# Patient Record
Sex: Female | Born: 1950 | Race: White | Hispanic: No | Marital: Married | Smoking: Current every day smoker
Health system: Southern US, Community
[De-identification: ages and names within clinical notes are randomized; demographics above are authoritative.]

## PROBLEM LIST (undated history)

## (undated) DIAGNOSIS — G629 Polyneuropathy, unspecified: Secondary | ICD-10-CM

## (undated) DIAGNOSIS — K219 Gastro-esophageal reflux disease without esophagitis: Secondary | ICD-10-CM

## (undated) DIAGNOSIS — G709 Myoneural disorder, unspecified: Secondary | ICD-10-CM

## (undated) DIAGNOSIS — G40909 Epilepsy, unspecified, not intractable, without status epilepticus: Secondary | ICD-10-CM

## (undated) DIAGNOSIS — G40A09 Absence epileptic syndrome, not intractable, without status epilepticus: Secondary | ICD-10-CM

## (undated) DIAGNOSIS — R062 Wheezing: Secondary | ICD-10-CM

## (undated) DIAGNOSIS — E119 Type 2 diabetes mellitus without complications: Secondary | ICD-10-CM

## (undated) DIAGNOSIS — F17200 Nicotine dependence, unspecified, uncomplicated: Secondary | ICD-10-CM

## (undated) DIAGNOSIS — G4700 Insomnia, unspecified: Secondary | ICD-10-CM

## (undated) DIAGNOSIS — J40 Bronchitis, not specified as acute or chronic: Secondary | ICD-10-CM

## (undated) DIAGNOSIS — E785 Hyperlipidemia, unspecified: Secondary | ICD-10-CM

## (undated) HISTORY — DX: Myoneural disorder, unspecified: G70.9

## (undated) HISTORY — DX: Epilepsy, unspecified, not intractable, without status epilepticus: G40.909

## (undated) HISTORY — DX: Gastro-esophageal reflux disease without esophagitis: K21.9

## (undated) HISTORY — PX: OVARIAN CYST REMOVAL: SHX89

## (undated) HISTORY — DX: Nicotine dependence, unspecified, uncomplicated: F17.200

## (undated) HISTORY — DX: Absence epileptic syndrome, not intractable, without status epilepticus: G40.A09

## (undated) HISTORY — DX: Polyneuropathy, unspecified: G62.9

## (undated) HISTORY — DX: Insomnia, unspecified: G47.00

## (undated) HISTORY — PX: BIOPSY THYROID: PRO38

## (undated) HISTORY — DX: Bronchitis, not specified as acute or chronic: J40

## (undated) HISTORY — DX: Type 2 diabetes mellitus without complications: E11.9

## (undated) HISTORY — DX: Hyperlipidemia, unspecified: E78.5

## (undated) HISTORY — DX: Wheezing: R06.2

---

## 2014-05-08 DIAGNOSIS — E119 Type 2 diabetes mellitus without complications: Secondary | ICD-10-CM | POA: Diagnosis not present

## 2014-05-08 DIAGNOSIS — R1013 Epigastric pain: Secondary | ICD-10-CM | POA: Diagnosis not present

## 2014-05-08 DIAGNOSIS — G629 Polyneuropathy, unspecified: Secondary | ICD-10-CM | POA: Diagnosis not present

## 2014-05-08 DIAGNOSIS — G4700 Insomnia, unspecified: Secondary | ICD-10-CM | POA: Diagnosis not present

## 2014-06-12 DIAGNOSIS — R112 Nausea with vomiting, unspecified: Secondary | ICD-10-CM | POA: Diagnosis not present

## 2014-06-12 DIAGNOSIS — F17200 Nicotine dependence, unspecified, uncomplicated: Secondary | ICD-10-CM | POA: Diagnosis not present

## 2014-06-12 DIAGNOSIS — R1013 Epigastric pain: Secondary | ICD-10-CM | POA: Diagnosis not present

## 2014-06-16 DIAGNOSIS — G629 Polyneuropathy, unspecified: Secondary | ICD-10-CM | POA: Diagnosis not present

## 2014-06-16 DIAGNOSIS — K2900 Acute gastritis without bleeding: Secondary | ICD-10-CM | POA: Diagnosis not present

## 2014-06-16 DIAGNOSIS — F17200 Nicotine dependence, unspecified, uncomplicated: Secondary | ICD-10-CM | POA: Diagnosis not present

## 2014-06-16 DIAGNOSIS — R112 Nausea with vomiting, unspecified: Secondary | ICD-10-CM | POA: Diagnosis not present

## 2014-06-16 DIAGNOSIS — K319 Disease of stomach and duodenum, unspecified: Secondary | ICD-10-CM | POA: Diagnosis not present

## 2014-06-16 DIAGNOSIS — E119 Type 2 diabetes mellitus without complications: Secondary | ICD-10-CM | POA: Diagnosis not present

## 2014-06-16 DIAGNOSIS — K449 Diaphragmatic hernia without obstruction or gangrene: Secondary | ICD-10-CM | POA: Diagnosis not present

## 2014-06-16 DIAGNOSIS — E780 Pure hypercholesterolemia: Secondary | ICD-10-CM | POA: Diagnosis not present

## 2014-06-16 DIAGNOSIS — K2970 Gastritis, unspecified, without bleeding: Secondary | ICD-10-CM | POA: Diagnosis not present

## 2014-06-16 DIAGNOSIS — R1013 Epigastric pain: Secondary | ICD-10-CM | POA: Diagnosis not present

## 2014-06-16 DIAGNOSIS — J40 Bronchitis, not specified as acute or chronic: Secondary | ICD-10-CM | POA: Diagnosis not present

## 2014-06-16 DIAGNOSIS — G4709 Other insomnia: Secondary | ICD-10-CM | POA: Diagnosis not present

## 2014-06-16 HISTORY — PX: ESOPHAGOGASTRODUODENOSCOPY: SHX1529

## 2014-07-02 DIAGNOSIS — G40A09 Absence epileptic syndrome, not intractable, without status epilepticus: Secondary | ICD-10-CM | POA: Diagnosis not present

## 2014-07-02 DIAGNOSIS — G4700 Insomnia, unspecified: Secondary | ICD-10-CM | POA: Diagnosis not present

## 2014-07-02 DIAGNOSIS — E119 Type 2 diabetes mellitus without complications: Secondary | ICD-10-CM | POA: Diagnosis not present

## 2014-07-02 DIAGNOSIS — E785 Hyperlipidemia, unspecified: Secondary | ICD-10-CM | POA: Diagnosis not present

## 2014-07-31 DIAGNOSIS — Z1231 Encounter for screening mammogram for malignant neoplasm of breast: Secondary | ICD-10-CM | POA: Diagnosis not present

## 2014-12-03 DIAGNOSIS — Z720 Tobacco use: Secondary | ICD-10-CM | POA: Diagnosis not present

## 2014-12-03 DIAGNOSIS — G2581 Restless legs syndrome: Secondary | ICD-10-CM | POA: Diagnosis not present

## 2014-12-03 DIAGNOSIS — E119 Type 2 diabetes mellitus without complications: Secondary | ICD-10-CM | POA: Diagnosis not present

## 2014-12-03 DIAGNOSIS — E785 Hyperlipidemia, unspecified: Secondary | ICD-10-CM | POA: Diagnosis not present

## 2014-12-23 DIAGNOSIS — F17200 Nicotine dependence, unspecified, uncomplicated: Secondary | ICD-10-CM | POA: Diagnosis not present

## 2014-12-23 DIAGNOSIS — G629 Polyneuropathy, unspecified: Secondary | ICD-10-CM | POA: Diagnosis not present

## 2014-12-23 DIAGNOSIS — E789 Disorder of lipoprotein metabolism, unspecified: Secondary | ICD-10-CM | POA: Diagnosis not present

## 2014-12-23 DIAGNOSIS — E118 Type 2 diabetes mellitus with unspecified complications: Secondary | ICD-10-CM | POA: Diagnosis not present

## 2015-02-03 DIAGNOSIS — E118 Type 2 diabetes mellitus with unspecified complications: Secondary | ICD-10-CM | POA: Diagnosis not present

## 2015-02-03 DIAGNOSIS — E789 Disorder of lipoprotein metabolism, unspecified: Secondary | ICD-10-CM | POA: Diagnosis not present

## 2015-02-03 DIAGNOSIS — G629 Polyneuropathy, unspecified: Secondary | ICD-10-CM | POA: Diagnosis not present

## 2015-05-26 DIAGNOSIS — E785 Hyperlipidemia, unspecified: Secondary | ICD-10-CM | POA: Diagnosis not present

## 2015-05-26 DIAGNOSIS — E119 Type 2 diabetes mellitus without complications: Secondary | ICD-10-CM | POA: Diagnosis not present

## 2015-05-26 DIAGNOSIS — G4700 Insomnia, unspecified: Secondary | ICD-10-CM | POA: Diagnosis not present

## 2015-05-26 DIAGNOSIS — G629 Polyneuropathy, unspecified: Secondary | ICD-10-CM | POA: Diagnosis not present

## 2015-09-01 DIAGNOSIS — L255 Unspecified contact dermatitis due to plants, except food: Secondary | ICD-10-CM | POA: Diagnosis not present

## 2015-09-01 DIAGNOSIS — Z1231 Encounter for screening mammogram for malignant neoplasm of breast: Secondary | ICD-10-CM | POA: Diagnosis not present

## 2015-10-14 DIAGNOSIS — L255 Unspecified contact dermatitis due to plants, except food: Secondary | ICD-10-CM | POA: Diagnosis not present

## 2015-11-16 DIAGNOSIS — S40869A Insect bite (nonvenomous) of unspecified upper arm, initial encounter: Secondary | ICD-10-CM | POA: Diagnosis not present

## 2015-11-16 DIAGNOSIS — W57XXXA Bitten or stung by nonvenomous insect and other nonvenomous arthropods, initial encounter: Secondary | ICD-10-CM | POA: Diagnosis not present

## 2015-11-16 DIAGNOSIS — L089 Local infection of the skin and subcutaneous tissue, unspecified: Secondary | ICD-10-CM | POA: Diagnosis not present

## 2015-11-16 DIAGNOSIS — S40269A Insect bite (nonvenomous) of unspecified shoulder, initial encounter: Secondary | ICD-10-CM | POA: Diagnosis not present

## 2015-11-25 DIAGNOSIS — E119 Type 2 diabetes mellitus without complications: Secondary | ICD-10-CM | POA: Diagnosis not present

## 2015-11-25 DIAGNOSIS — G4700 Insomnia, unspecified: Secondary | ICD-10-CM | POA: Diagnosis not present

## 2015-11-25 DIAGNOSIS — M8580 Other specified disorders of bone density and structure, unspecified site: Secondary | ICD-10-CM | POA: Diagnosis not present

## 2015-11-25 DIAGNOSIS — K219 Gastro-esophageal reflux disease without esophagitis: Secondary | ICD-10-CM | POA: Diagnosis not present

## 2015-11-25 DIAGNOSIS — Z0000 Encounter for general adult medical examination without abnormal findings: Secondary | ICD-10-CM | POA: Diagnosis not present

## 2016-05-04 DIAGNOSIS — E785 Hyperlipidemia, unspecified: Secondary | ICD-10-CM | POA: Diagnosis not present

## 2016-05-04 DIAGNOSIS — G629 Polyneuropathy, unspecified: Secondary | ICD-10-CM | POA: Diagnosis not present

## 2016-05-04 DIAGNOSIS — G4700 Insomnia, unspecified: Secondary | ICD-10-CM | POA: Diagnosis not present

## 2016-05-04 DIAGNOSIS — E119 Type 2 diabetes mellitus without complications: Secondary | ICD-10-CM | POA: Diagnosis not present

## 2016-10-21 DIAGNOSIS — G4700 Insomnia, unspecified: Secondary | ICD-10-CM | POA: Diagnosis not present

## 2016-10-21 DIAGNOSIS — Z0000 Encounter for general adult medical examination without abnormal findings: Secondary | ICD-10-CM | POA: Diagnosis not present

## 2016-10-21 DIAGNOSIS — M25473 Effusion, unspecified ankle: Secondary | ICD-10-CM | POA: Diagnosis not present

## 2016-10-21 DIAGNOSIS — E119 Type 2 diabetes mellitus without complications: Secondary | ICD-10-CM | POA: Diagnosis not present

## 2016-10-21 DIAGNOSIS — Z9181 History of falling: Secondary | ICD-10-CM | POA: Diagnosis not present

## 2016-11-29 DIAGNOSIS — N390 Urinary tract infection, site not specified: Secondary | ICD-10-CM | POA: Diagnosis not present

## 2017-01-31 DIAGNOSIS — Z1339 Encounter for screening examination for other mental health and behavioral disorders: Secondary | ICD-10-CM | POA: Diagnosis not present

## 2017-01-31 DIAGNOSIS — R600 Localized edema: Secondary | ICD-10-CM | POA: Diagnosis not present

## 2017-01-31 DIAGNOSIS — Z23 Encounter for immunization: Secondary | ICD-10-CM | POA: Diagnosis not present

## 2017-01-31 DIAGNOSIS — Z1331 Encounter for screening for depression: Secondary | ICD-10-CM | POA: Diagnosis not present

## 2017-04-24 DIAGNOSIS — R05 Cough: Secondary | ICD-10-CM | POA: Diagnosis not present

## 2017-04-24 DIAGNOSIS — Z1339 Encounter for screening examination for other mental health and behavioral disorders: Secondary | ICD-10-CM | POA: Diagnosis not present

## 2017-08-04 DIAGNOSIS — E119 Type 2 diabetes mellitus without complications: Secondary | ICD-10-CM | POA: Diagnosis not present

## 2017-08-04 DIAGNOSIS — E785 Hyperlipidemia, unspecified: Secondary | ICD-10-CM | POA: Diagnosis not present

## 2017-08-04 DIAGNOSIS — Z0000 Encounter for general adult medical examination without abnormal findings: Secondary | ICD-10-CM | POA: Diagnosis not present

## 2017-08-04 DIAGNOSIS — Z23 Encounter for immunization: Secondary | ICD-10-CM | POA: Diagnosis not present

## 2017-08-21 DIAGNOSIS — Z1212 Encounter for screening for malignant neoplasm of rectum: Secondary | ICD-10-CM | POA: Diagnosis not present

## 2017-08-21 DIAGNOSIS — Z1211 Encounter for screening for malignant neoplasm of colon: Secondary | ICD-10-CM | POA: Diagnosis not present

## 2017-09-12 DIAGNOSIS — Z1231 Encounter for screening mammogram for malignant neoplasm of breast: Secondary | ICD-10-CM | POA: Diagnosis not present

## 2017-12-21 DIAGNOSIS — R51 Headache: Secondary | ICD-10-CM | POA: Diagnosis not present

## 2017-12-21 DIAGNOSIS — S0990XA Unspecified injury of head, initial encounter: Secondary | ICD-10-CM | POA: Diagnosis not present

## 2017-12-21 DIAGNOSIS — S161XXA Strain of muscle, fascia and tendon at neck level, initial encounter: Secondary | ICD-10-CM | POA: Diagnosis not present

## 2018-01-04 DIAGNOSIS — E041 Nontoxic single thyroid nodule: Secondary | ICD-10-CM | POA: Diagnosis not present

## 2018-01-04 DIAGNOSIS — Z23 Encounter for immunization: Secondary | ICD-10-CM | POA: Diagnosis not present

## 2018-01-04 DIAGNOSIS — G4700 Insomnia, unspecified: Secondary | ICD-10-CM | POA: Diagnosis not present

## 2018-01-09 ENCOUNTER — Ambulatory Visit
Admission: RE | Admit: 2018-01-09 | Discharge: 2018-01-09 | Disposition: A | Discharge status: Home / Self Care | Payer: Medicare Other | Source: Ambulatory Visit | Attending: Otolaryngology | Admitting: Otolaryngology

## 2018-01-09 ENCOUNTER — Other Ambulatory Visit: Payer: Self-pay | Admitting: Otolaryngology

## 2018-01-09 DIAGNOSIS — H6121 Impacted cerumen, right ear: Secondary | ICD-10-CM | POA: Diagnosis not present

## 2018-01-09 DIAGNOSIS — J040 Acute laryngitis: Secondary | ICD-10-CM | POA: Diagnosis not present

## 2018-01-09 DIAGNOSIS — K2100 Gastro-esophageal reflux disease with esophagitis, without bleeding: Secondary | ICD-10-CM

## 2018-01-09 DIAGNOSIS — E041 Nontoxic single thyroid nodule: Secondary | ICD-10-CM | POA: Diagnosis not present

## 2018-01-09 DIAGNOSIS — R130 Aphagia: Secondary | ICD-10-CM | POA: Diagnosis not present

## 2018-01-09 DIAGNOSIS — I700 Atherosclerosis of aorta: Secondary | ICD-10-CM | POA: Diagnosis not present

## 2018-01-10 ENCOUNTER — Other Ambulatory Visit: Payer: Self-pay | Admitting: Otolaryngology

## 2018-01-10 DIAGNOSIS — E041 Nontoxic single thyroid nodule: Secondary | ICD-10-CM

## 2018-01-16 ENCOUNTER — Other Ambulatory Visit: Payer: Medicare Other

## 2018-01-18 ENCOUNTER — Ambulatory Visit
Admission: RE | Admit: 2018-01-18 | Discharge: 2018-01-18 | Disposition: A | Discharge status: Home / Self Care | Payer: Medicare Other | Source: Ambulatory Visit | Attending: Otolaryngology | Admitting: Otolaryngology

## 2018-01-18 DIAGNOSIS — E042 Nontoxic multinodular goiter: Secondary | ICD-10-CM | POA: Diagnosis not present

## 2018-01-18 DIAGNOSIS — E041 Nontoxic single thyroid nodule: Secondary | ICD-10-CM

## 2018-02-07 DIAGNOSIS — R918 Other nonspecific abnormal finding of lung field: Secondary | ICD-10-CM | POA: Diagnosis not present

## 2018-02-09 ENCOUNTER — Other Ambulatory Visit: Payer: Self-pay | Admitting: Otolaryngology

## 2018-02-09 DIAGNOSIS — E041 Nontoxic single thyroid nodule: Secondary | ICD-10-CM

## 2018-02-19 DIAGNOSIS — Z5321 Procedure and treatment not carried out due to patient leaving prior to being seen by health care provider: Secondary | ICD-10-CM | POA: Diagnosis not present

## 2018-02-19 DIAGNOSIS — Z043 Encounter for examination and observation following other accident: Secondary | ICD-10-CM | POA: Diagnosis not present

## 2018-02-27 ENCOUNTER — Other Ambulatory Visit (HOSPITAL_COMMUNITY)
Admission: RE | Admit: 2018-02-27 | Discharge: 2018-02-27 | Disposition: A | Discharge status: Home / Self Care | Payer: Medicare Other | Source: Ambulatory Visit | Attending: Radiology | Admitting: Radiology

## 2018-02-27 ENCOUNTER — Ambulatory Visit
Admission: RE | Admit: 2018-02-27 | Discharge: 2018-02-27 | Disposition: A | Discharge status: Home / Self Care | Payer: Medicare Other | Source: Ambulatory Visit | Attending: Otolaryngology | Admitting: Otolaryngology

## 2018-02-27 DIAGNOSIS — E041 Nontoxic single thyroid nodule: Secondary | ICD-10-CM | POA: Insufficient documentation

## 2018-03-01 DIAGNOSIS — G4700 Insomnia, unspecified: Secondary | ICD-10-CM | POA: Diagnosis not present

## 2018-05-25 DIAGNOSIS — G4700 Insomnia, unspecified: Secondary | ICD-10-CM | POA: Diagnosis not present

## 2018-05-25 DIAGNOSIS — J329 Chronic sinusitis, unspecified: Secondary | ICD-10-CM | POA: Diagnosis not present

## 2018-05-25 DIAGNOSIS — J40 Bronchitis, not specified as acute or chronic: Secondary | ICD-10-CM | POA: Diagnosis not present

## 2018-10-03 DIAGNOSIS — Z0000 Encounter for general adult medical examination without abnormal findings: Secondary | ICD-10-CM | POA: Diagnosis not present

## 2018-10-03 DIAGNOSIS — E1169 Type 2 diabetes mellitus with other specified complication: Secondary | ICD-10-CM | POA: Diagnosis not present

## 2018-10-03 DIAGNOSIS — E785 Hyperlipidemia, unspecified: Secondary | ICD-10-CM | POA: Diagnosis not present

## 2018-10-03 DIAGNOSIS — Z79899 Other long term (current) drug therapy: Secondary | ICD-10-CM | POA: Diagnosis not present

## 2018-11-15 DIAGNOSIS — T148XXA Other injury of unspecified body region, initial encounter: Secondary | ICD-10-CM | POA: Diagnosis not present

## 2018-12-27 DIAGNOSIS — M545 Low back pain: Secondary | ICD-10-CM | POA: Diagnosis not present

## 2019-02-07 DIAGNOSIS — Z23 Encounter for immunization: Secondary | ICD-10-CM | POA: Diagnosis not present

## 2019-02-16 DIAGNOSIS — M546 Pain in thoracic spine: Secondary | ICD-10-CM | POA: Diagnosis not present

## 2019-02-16 DIAGNOSIS — M5124 Other intervertebral disc displacement, thoracic region: Secondary | ICD-10-CM | POA: Diagnosis not present

## 2019-02-19 DIAGNOSIS — M546 Pain in thoracic spine: Secondary | ICD-10-CM | POA: Diagnosis not present

## 2019-02-27 DIAGNOSIS — E1169 Type 2 diabetes mellitus with other specified complication: Secondary | ICD-10-CM | POA: Diagnosis not present

## 2019-02-27 DIAGNOSIS — Z79899 Other long term (current) drug therapy: Secondary | ICD-10-CM | POA: Diagnosis not present

## 2019-02-27 DIAGNOSIS — E785 Hyperlipidemia, unspecified: Secondary | ICD-10-CM | POA: Diagnosis not present

## 2019-06-20 ENCOUNTER — Ambulatory Visit: Payer: Medicare Other | Attending: Internal Medicine

## 2019-06-20 DIAGNOSIS — Z23 Encounter for immunization: Secondary | ICD-10-CM | POA: Insufficient documentation

## 2019-06-20 NOTE — Progress Notes (Signed)
   Covid-19 Vaccination Clinic  Name:  Yolanda Woods    MRN: [id number] DOB: 06-Sep-1950  06/20/2019  Ms. Yolanda Woods was observed post Covid-19 immunization for 15 minutes without incidence. She was provided with Vaccine Information Sheet and instruction to access the V-Safe system.   Ms. Yolanda Woods was instructed to call 911 with any severe reactions post vaccine: Marland Kitchen Difficulty breathing  . Swelling of your face and throat  . A fast heartbeat  . A bad rash all over your body  . Dizziness and weakness    Immunizations Administered    Name Date Dose VIS Date Route   Pfizer COVID-19 Vaccine 06/20/2019  2:54 PM 0.3 mL 04/05/2019 Intramuscular   Manufacturer: [place]   Lot: [id number]   [place]: [id number]

## 2019-07-16 ENCOUNTER — Ambulatory Visit: Payer: Medicare Other | Attending: Internal Medicine

## 2019-07-16 DIAGNOSIS — Z23 Encounter for immunization: Secondary | ICD-10-CM

## 2019-07-16 NOTE — Progress Notes (Signed)
   Covid-19 Vaccination Clinic  Name:  MAEKAYLA NORBUT    MRN: [id number] DOB: December 13, 1950  07/16/2019  Ms. Vineyard was observed post Covid-19 immunization for 15 minutes without incident. She was provided with Vaccine Information Sheet and instruction to access the V-Safe system.   Ms. Gaul was instructed to call 911 with any severe reactions post vaccine: Marland Kitchen Difficulty breathing  . Swelling of face and throat  . A fast heartbeat  . A bad rash all over body  . Dizziness and weakness   Immunizations Administered    Name Date Dose VIS Date Route   Pfizer COVID-19 Vaccine 07/16/2019  2:17 PM 0.3 mL 04/05/2019 Intramuscular   Manufacturer: [place]   Lot: [id number]   [place]: [id number]

## 2019-08-19 IMAGING — US US FNA BIOPSY THYROID 1ST LESION
1 series · 13 of 19 positions shown · non-contrast
Comparison: US Thyroid 01/18/18

MEDICATIONS:
5 cc 1% lidocaine

COMPLICATIONS:
None immediate.

INDICATION: Indeterminate thyroid nodule

Right inferior thyroid nodule
2.8 cm
EXAM:
ULTRASOUND GUIDED FINE NEEDLE ASPIRATION OF INDETERMINATE THYROID
NODULE
TECHNIQUE: Informed written consent was obtained from the patient after a
discussion of the risks, benefits and alternatives to treatment.
Questions regarding the procedure were encouraged and answered. A
timeout was performed prior to the initiation of the procedure.

[Series 1: us fna biopsy thyroid 1st lesion · 0.06mm/px · 19 acquisitions, 13 frames shown]
[im 1/19]
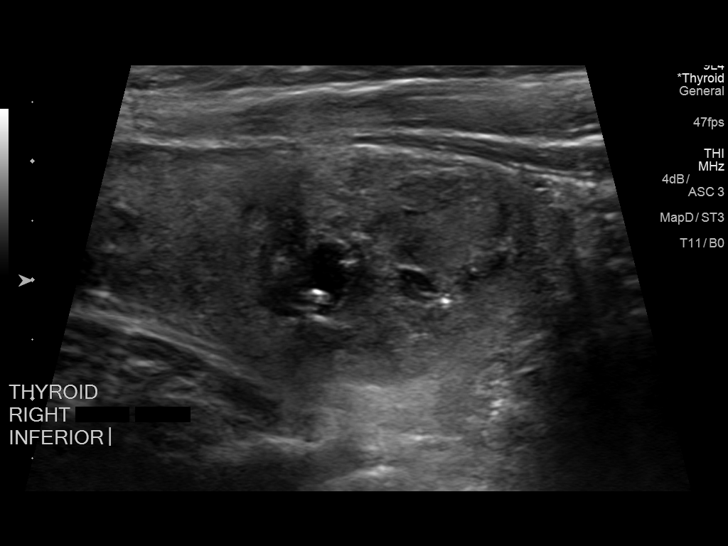
[im 3/19]
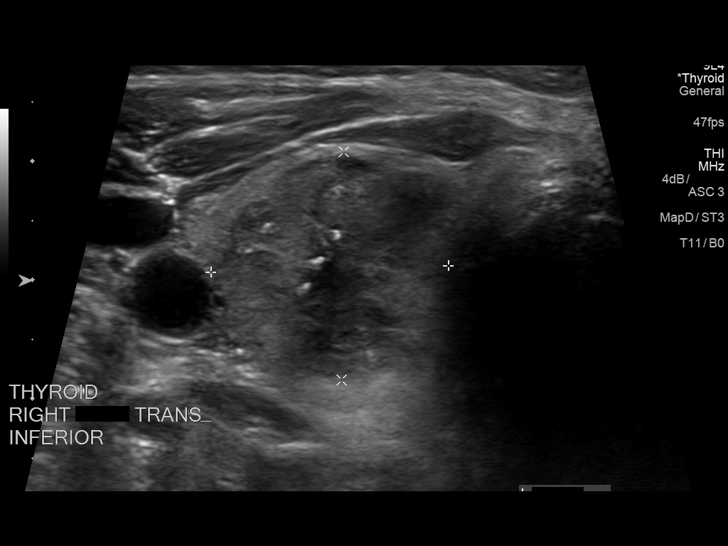
[im 4/19]
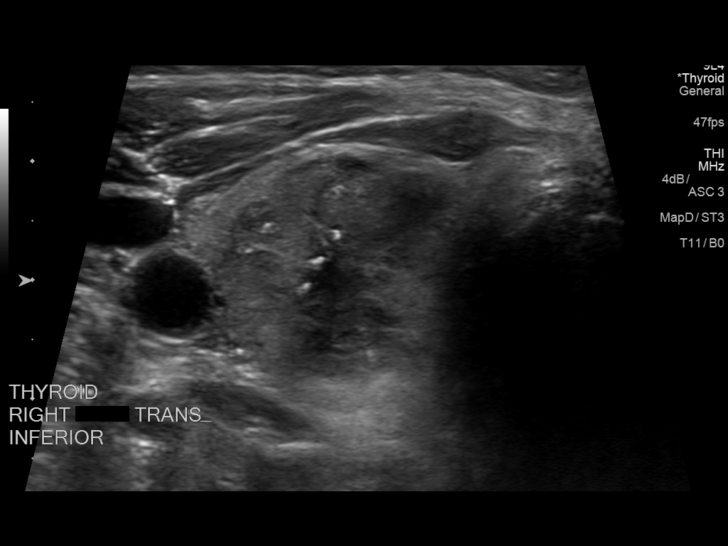
[im 6/19]
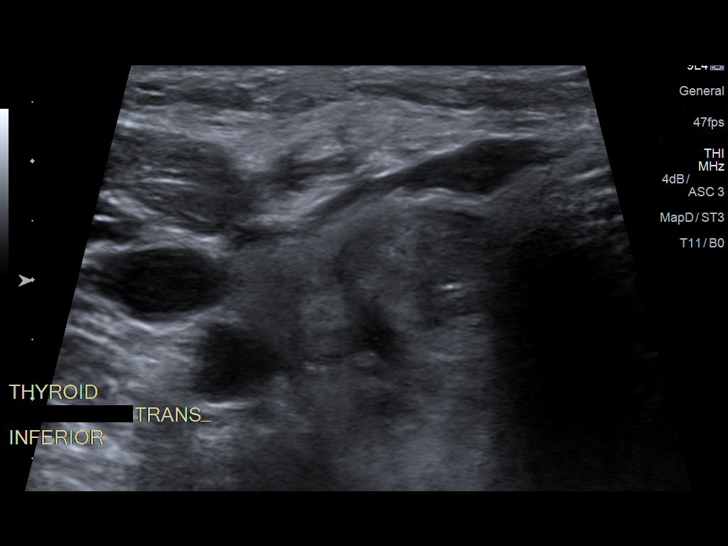
[im 7/19]
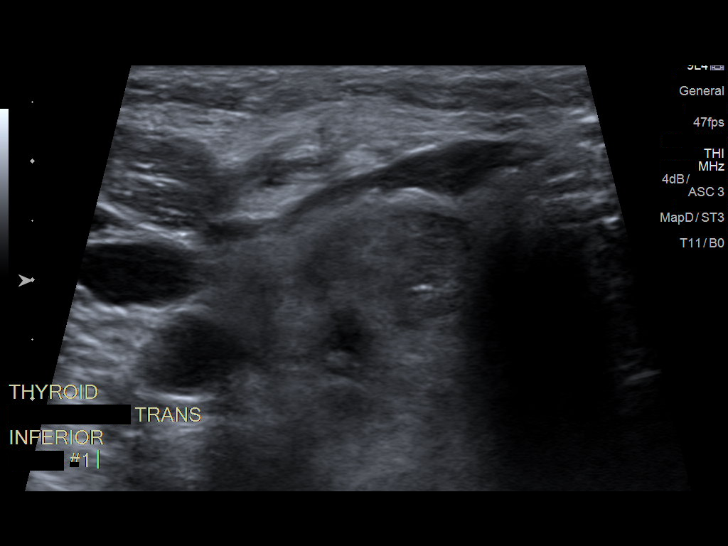
[im 9/19]
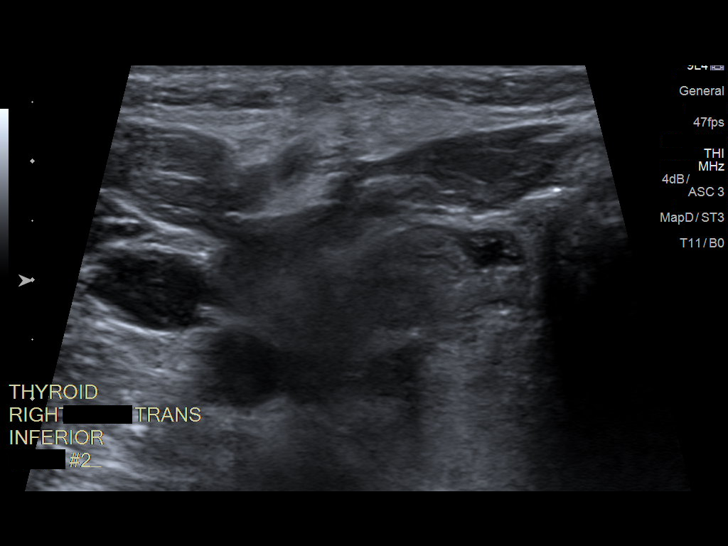
[im 10/19]
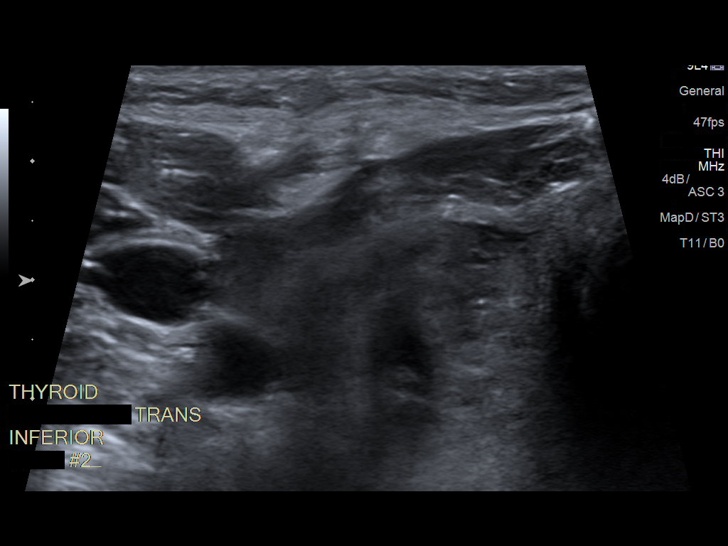
[im 11/19]
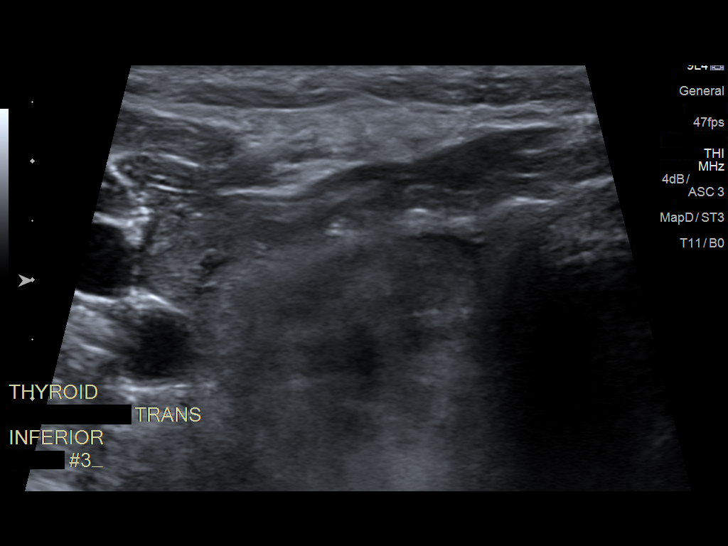
[im 13/19]
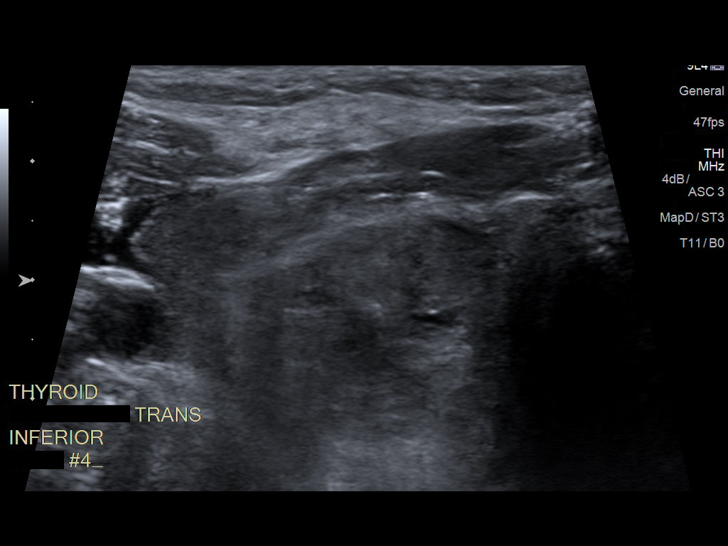
[im 14/19]
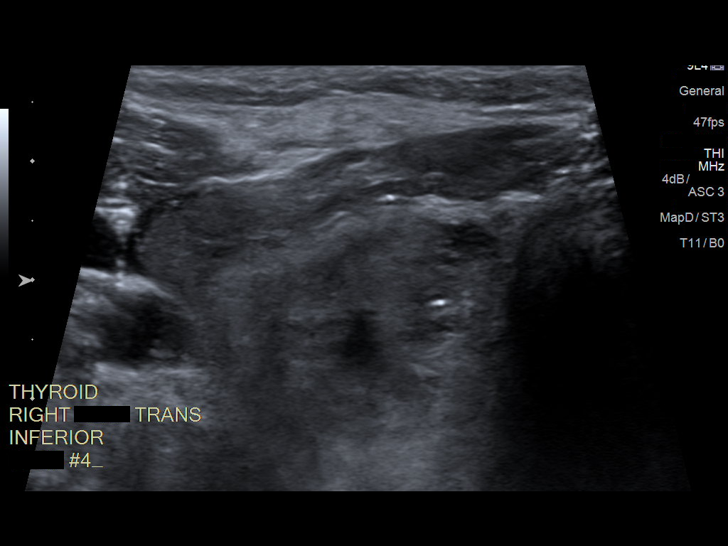
[im 16/19]
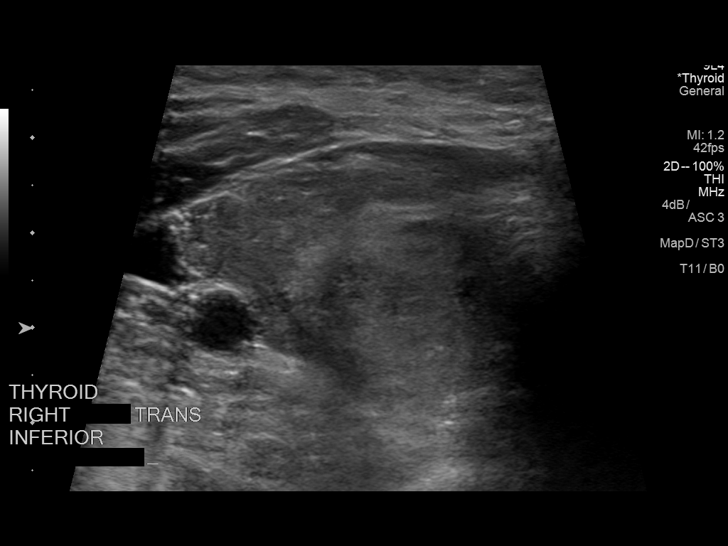
[im 17/19]
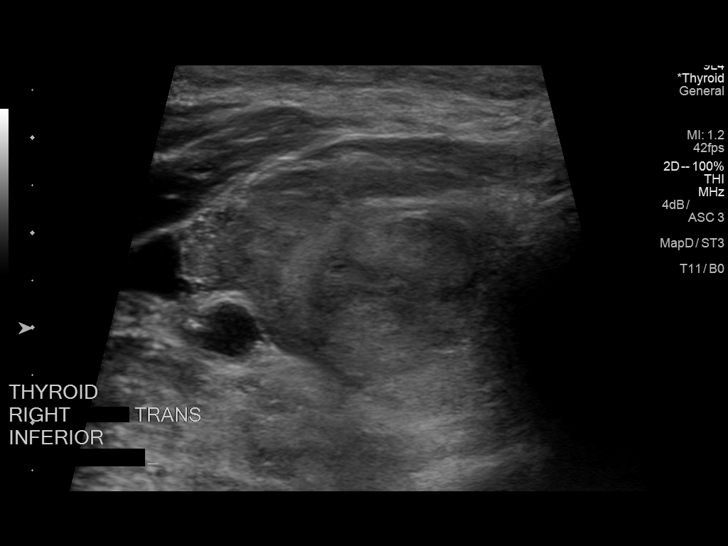
[im 19/19]
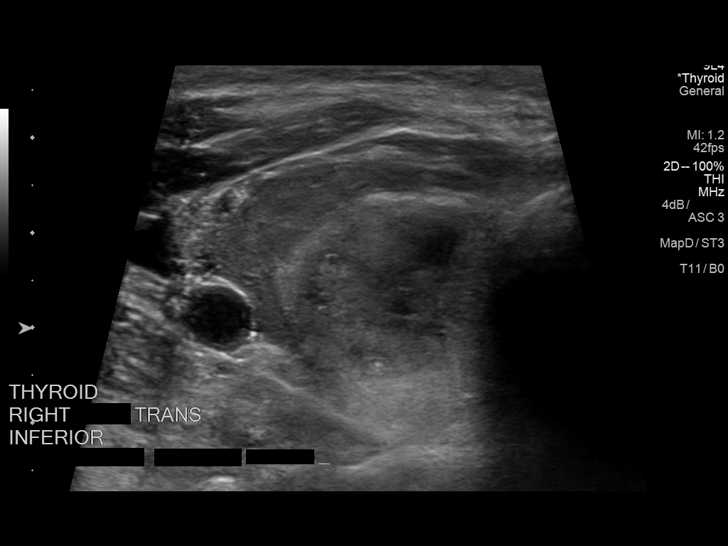

[13 of 19 positions shown; findings below may reference images not displayed]

Pre-procedural ultrasound scanning demonstrated unchanged size and
appearance of the indeterminate nodule within the right thyroid

The procedure was planned. The neck was prepped in the usual sterile
fashion, and a sterile drape was applied covering the operative
field. A timeout was performed prior to the initiation of the
procedure. Local anesthesia was provided with 1% lidocaine.

Under direct ultrasound guidance, 4 FNA biopsies were performed of
the right inferior thyroid nodule with a 25 gauge needle. Multiple
ultrasound images were saved for procedural documentation purposes.
The samples were prepared and submitted to pathology.

Limited post procedural scanning was positive for hematoma but no
additional complication. Dressings were placed. The patient
tolerated the above procedures procedure well without immediate
postprocedural complication.
FINDINGS: Nodule reference number based on prior diagnostic ultrasound: 3

Maximum size: 2.8 cm

Location: Right; Inferior

ACR TI-RADS risk category: TR4 (4-6 points)

Reason for biopsy: meets ACR TI-RADS criteria

Ultrasound imaging confirms appropriate placement of the needles
within the thyroid nodule.
IMPRESSION: Technically successful ultrasound guided fine needle aspiration of
right inferior thyroid nodule

Read by

Saranghanan Pitiri

## 2019-08-22 DIAGNOSIS — E1169 Type 2 diabetes mellitus with other specified complication: Secondary | ICD-10-CM | POA: Diagnosis not present

## 2019-08-22 DIAGNOSIS — Z0000 Encounter for general adult medical examination without abnormal findings: Secondary | ICD-10-CM | POA: Diagnosis not present

## 2019-08-22 DIAGNOSIS — Z79899 Other long term (current) drug therapy: Secondary | ICD-10-CM | POA: Diagnosis not present

## 2019-08-22 DIAGNOSIS — E785 Hyperlipidemia, unspecified: Secondary | ICD-10-CM | POA: Diagnosis not present

## 2019-09-23 DIAGNOSIS — E1169 Type 2 diabetes mellitus with other specified complication: Secondary | ICD-10-CM | POA: Diagnosis not present

## 2019-09-23 DIAGNOSIS — E7800 Pure hypercholesterolemia, unspecified: Secondary | ICD-10-CM | POA: Diagnosis not present

## 2019-09-23 DIAGNOSIS — K219 Gastro-esophageal reflux disease without esophagitis: Secondary | ICD-10-CM | POA: Diagnosis not present

## 2019-09-30 DIAGNOSIS — Z1231 Encounter for screening mammogram for malignant neoplasm of breast: Secondary | ICD-10-CM | POA: Diagnosis not present

## 2019-10-10 ENCOUNTER — Encounter: Payer: Self-pay | Admitting: Gastroenterology

## 2019-10-14 DIAGNOSIS — K529 Noninfective gastroenteritis and colitis, unspecified: Secondary | ICD-10-CM | POA: Diagnosis not present

## 2019-11-28 ENCOUNTER — Ambulatory Visit (INDEPENDENT_AMBULATORY_CARE_PROVIDER_SITE_OTHER): Payer: Medicare Other | Admitting: Gastroenterology

## 2019-11-28 ENCOUNTER — Other Ambulatory Visit: Payer: Self-pay

## 2019-11-28 ENCOUNTER — Encounter: Payer: Self-pay | Admitting: Gastroenterology

## 2019-11-28 VITALS — BP 142/82 | HR 70 | Ht 65.0 in | Wt 148.5 lb

## 2019-11-28 DIAGNOSIS — R1310 Dysphagia, unspecified: Secondary | ICD-10-CM

## 2019-11-28 DIAGNOSIS — K219 Gastro-esophageal reflux disease without esophagitis: Secondary | ICD-10-CM

## 2019-11-28 NOTE — Patient Instructions (Signed)
If you are age 69 or older, your body mass index should be between 23-30. Your Body mass index is 24.71 kg/m. If this is out of the aforementioned range listed, please consider follow up with your Primary Care Provider.  If you are age 73 or younger, your body mass index should be between 19-25. Your Body mass index is 24.71 kg/m. If this is out of the aformentioned range listed, please consider follow up with your Primary Care Provider.   You have been scheduled for an endoscopy. Please follow written instructions given to you at your visit today. If you use inhalers (even only as needed), please bring them with you on the day of your procedure.   Thank you,  Dr. Jackquline Denmark

## 2019-11-28 NOTE — Progress Notes (Signed)
Chief Complaint: Epi pain  Referring Provider:  Ronita Hipps, MD      ASSESSMENT AND PLAN;   #1. GERD with epi pain  #2. Eso dysphagia, likely due to Schatzki's ring. R/O other causes.  Plan: -Increase Omeprazole 20mg  po bid -EGD with dil and SB Bx -If still with problems, [place] abdo followed by CT if needed. -Labs from Dr [employer] office and last note. -Stop smoking -Avoid nonsteroidals.  -Addendum: Will also obtain CBC, CMP, lipase at the time of EGD  Addendum: obtain labs from Dr. Greggory Keen office: Hemoglobin 11.3, MCV 80, platelets 201, normal CMP with normal LFTs.  (07/2019).  Baseline labs 02/27/2019 showed hemoglobin 11.6, MCV 81.  Also from the notes she had declined colonoscopy. HPI:    Yolanda Woods is a 69 y.o. female  With PMH of DM2 with neuropathy, HLD, SZ D/O, COPD with continued smoking C/O postprandial epi pain x 6 months, getting worse with occasional nausea but no vomiting Lately has been having solid food intermittent dysphagia, mid chest, mostly with meats and breads.  Denies having any heartburn with omeprazole but has occasional regurgitation.   Has occ diarrhea but not bad ever since she has been on Metformin.  Had a negative Cologuard test last month 10/2019.  Does not want colonoscopy.  Has declined colonoscopy previously.  No sodas, chocolates, chewing gums, artificial sweeteners and candy. No NSAIDs  Still smokes but has been trying to quit.  No weight loss or loss of appetite.  No melena or hematochezia.  No fever chills or night sweats.  No jaundice dark urine or pale stools.  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  Past GI procedures: -EGD February 2016: 3 cm HH, mild gastritis. Neg Bx for HP Past Medical History:  Diagnosis Date  . 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 Bronchitis   . Hyperlipidemia   . Insomnia   . Neuropathy   . Seizure disorder ([place])   . Tobacco use disorder   . Type 2 diabetes mellitus ([place])   . Wheezing      Past Surgical History:  Procedure Laterality Date  . BIOPSY THYROID     thyroid fna Dr Ernesto Rutherford Cytopathology revealed consistent with benign follicular nodule (Bethesda Category II)   . ESOPHAGOGASTRODUODENOSCOPY  06/16/2014   Hiatal hernia. Mild gastritis.  . OVARIAN CYST REMOVAL      Family History  Problem Relation Age of Onset  . Diabetes Mother   . Heart disease Mother   . Colon cancer Neg Hx   . Esophageal cancer Neg Hx     Social History   Tobacco Use  . Smoking status: Current Every Day Smoker  . Smokeless tobacco: Never Used  Vaping Use  . Vaping Use: Never used  Substance Use Topics  . Alcohol use: Not Currently  . Drug use: Not Currently    Current Outpatient Medications  Medication Sig Dispense Refill  . atorvastatin (LIPITOR) 40 MG tablet Take 40 mg by mouth daily.    . carbamazepine (TEGRETOL) 200 MG tablet Take 200 mg by mouth 2 (two) times daily.    . furosemide (LASIX) 40 MG tablet Take 40 mg by mouth daily.    Marland Kitchen gabapentin (NEURONTIN) 600 MG tablet Take 600 mg by mouth 3 (three) times daily.    . metFORMIN (GLUCOPHAGE-XR) 500 MG 24 hr tablet Take 1,000 mg by mouth 2 (two) times daily.    Marland Kitchen omeprazole (PRILOSEC) 20 MG capsule Take 20 mg  by mouth daily.    Marland Kitchen zolpidem (AMBIEN) 10 MG tablet Take 10 mg by mouth as needed (Will have a prescription for 12.5 every now and then).      No current facility-administered medications for this visit.    Allergies  Allergen Reactions  . Codeine     Review of Systems:  Constitutional: Denies fever, chills, diaphoresis, appetite change and fatigue.  HEENT: Denies photophobia, eye pain, redness, hearing loss, ear pain, congestion, sore throat, rhinorrhea, sneezing, mouth sores, neck pain, neck stiffness and tinnitus.   Respiratory: Denies SOB, DOE, cough, chest tightness,  and wheezing.   Cardiovascular: Denies chest pain, palpitations and leg swelling.  Genitourinary: Denies dysuria, urgency, frequency,  hematuria, flank pain and difficulty urinating.  Musculoskeletal: Denies myalgias, back pain, joint swelling, arthralgias and gait problem.  Skin: No rash.  Neurological: Denies dizziness, Had seizures, No syncope, weakness, light-headedness, numbness and headaches.  Hematological: Denies adenopathy. Easy bruising, personal or family bleeding history  Psychiatric/Behavioral: Has anxiety or depression.  Sleeping problems.     Physical Exam:    BP (!) 142/82   Pulse 70   Ht 5\' 5"  (1.651 m)   Wt 148 lb 8 oz (67.4 kg)   BMI 24.71 kg/m  Wt Readings from Last 3 Encounters:  11/28/19 148 lb 8 oz (67.4 kg)   Constitutional:  Well-developed, in no acute distress. Psychiatric: Normal mood and affect. Behavior is normal. HEENT: Pupils normal.  Conjunctivae are normal. No scleral icterus.  Cardiovascular: Normal rate, regular rhythm. No edema Pulmonary/chest: Bilateral decreased breath sounds no wheezing, rales or rhonchi. Abdominal: Soft, nondistended. Nontender. Bowel sounds active throughout. There are no masses palpable. No hepatomegaly. Rectal:  defered Neurological: Alert and oriented to person place and time. Skin: Skin is warm and dry. No rashes noted.  Data Reviewed: I have personally reviewed following labs and imaging studies     Carmell Austria, MD 11/28/2019, 10:48 AM  Cc: Ronita Hipps, MD

## 2019-12-10 ENCOUNTER — Encounter: Payer: Self-pay | Admitting: Gastroenterology

## 2019-12-11 DIAGNOSIS — G629 Polyneuropathy, unspecified: Secondary | ICD-10-CM | POA: Diagnosis not present

## 2019-12-11 DIAGNOSIS — L989 Disorder of the skin and subcutaneous tissue, unspecified: Secondary | ICD-10-CM | POA: Diagnosis not present

## 2019-12-18 ENCOUNTER — Encounter: Payer: Self-pay | Admitting: Certified Registered Nurse Anesthetist

## 2019-12-19 ENCOUNTER — Encounter: Payer: Self-pay | Admitting: Gastroenterology

## 2019-12-19 ENCOUNTER — Other Ambulatory Visit: Payer: Self-pay

## 2019-12-19 ENCOUNTER — Ambulatory Visit (AMBULATORY_SURGERY_CENTER): Payer: Medicare Other | Admitting: Gastroenterology

## 2019-12-19 VITALS — BP 142/68 | HR 62 | Temp 97.3°F | Resp 16 | Ht 65.0 in | Wt 148.0 lb

## 2019-12-19 DIAGNOSIS — K319 Disease of stomach and duodenum, unspecified: Secondary | ICD-10-CM

## 2019-12-19 DIAGNOSIS — K2900 Acute gastritis without bleeding: Secondary | ICD-10-CM | POA: Diagnosis not present

## 2019-12-19 DIAGNOSIS — K449 Diaphragmatic hernia without obstruction or gangrene: Secondary | ICD-10-CM | POA: Diagnosis not present

## 2019-12-19 DIAGNOSIS — R1310 Dysphagia, unspecified: Secondary | ICD-10-CM

## 2019-12-19 DIAGNOSIS — K219 Gastro-esophageal reflux disease without esophagitis: Secondary | ICD-10-CM

## 2019-12-19 DIAGNOSIS — K254 Chronic or unspecified gastric ulcer with hemorrhage: Secondary | ICD-10-CM | POA: Diagnosis not present

## 2019-12-19 MED ORDER — SODIUM CHLORIDE 0.9 % IV SOLN: 500.0000 mL | Freq: Once | INTRAVENOUS | Status: DC

## 2019-12-19 NOTE — Progress Notes (Signed)
Robinul 0.1 mg IV given due large amount of secretions upon assessment.  MD made aware, vss 

## 2019-12-19 NOTE — Progress Notes (Signed)
Report given to PACU, vss 

## 2019-12-19 NOTE — Patient Instructions (Signed)
Hiatal hernia and mild gastritis. Avoid nsaids if possible. Continue Omeprazole 20 mg.  Dilation diet for today: Nothing by mouth until 12:30 pm Clear liquids until 1:30 pm Soft diet for the remainder of the day. Resume regular diet tomorrow.  Resume previous medications.  Await pathology for final recommendations.  Handouts on findings given to patient.    YOU HAD AN ENDOSCOPIC PROCEDURE TODAY AT [facility]:   Refer to the procedure report that was given to you for any specific questions about what was found during the examination.  If the procedure report does not answer your questions, please call your gastroenterologist to clarify.  If you requested that your care partner not be given the details of your procedure findings, then the procedure report has been included in a sealed envelope for you to review at your convenience later.  YOU SHOULD EXPECT: Some feelings of bloating in the abdomen. Passage of more gas than usual.  Walking can help get rid of the air that was put into your GI tract during the procedure and reduce the bloating. If you had a lower endoscopy (such as a colonoscopy or flexible sigmoidoscopy) you may notice spotting of blood in your stool or on the toilet paper. If you underwent a bowel prep for your procedure, you may not have a normal bowel movement for a few days.  Please Note:  You might notice some irritation and congestion in your nose or some drainage.  This is from the oxygen used during your procedure.  There is no need for concern and it should clear up in a day or so.  SYMPTOMS TO REPORT IMMEDIATELY:   Following lower endoscopy (colonoscopy or flexible sigmoidoscopy):  Excessive amounts of blood in the stool  Significant tenderness or worsening of abdominal pains  Swelling of the abdomen that is new, acute  Fever of 100F or higher   Following upper endoscopy (EGD)  Vomiting of blood or coffee ground material  New chest pain or pain  under the shoulder blades  Painful or persistently difficult swallowing  New shortness of breath  Fever of 100F or higher  Black, tarry-looking stools  For urgent or emergent issues, a gastroenterologist can be reached at any hour by calling [phone]. Do not use MyChart messaging for urgent concerns.    DIET:  We do recommend a small meal at first, but then you may proceed to your regular diet.  Drink plenty of fluids but you should avoid alcoholic beverages for 24 hours.  ACTIVITY:  You should plan to take it easy for the rest of today and you should NOT DRIVE or use heavy machinery until tomorrow (because of the sedation medicines used during the test).    FOLLOW UP: Our staff will call the number listed on your records 48-72 hours following your procedure to check on you and address any questions or concerns that you may have regarding the information given to you following your procedure. If we do not reach you, we will leave a message.  We will attempt to reach you two times.  During this call, we will ask if you have developed any symptoms of COVID 19. If you develop any symptoms (ie: fever, flu-like symptoms, shortness of breath, cough etc.) before then, please call [phone].  If you test positive for Covid 19 in the 2 weeks post procedure, please call and report this information to [place].    If any biopsies were taken you will be contacted by  phone or by letter within the next 1-3 weeks.  Please call us at [phone] if you have not heard about the biopsies in 3 weeks.    SIGNATURES/CONFIDENTIALITY: You and/or your care partner have signed paperwork which will be entered into your electronic medical record.  These signatures attest to the fact that that the information above on your After Visit Summary has been reviewed and is understood.  Full responsibility of the confidentiality of this discharge information lies with you and/or your care-partner.

## 2019-12-19 NOTE — Progress Notes (Signed)
VS-CW 

## 2019-12-19 NOTE — Op Note (Signed)
[facility] Patient Name: Yolanda Woods Procedure Date: 12/19/2019 10:13 AM MRN: [id number] Endoscopist: Jackquline Denmark , MD Age: 69 Referring MD:  Date of Birth: [birth year] Gender: Female Account #: [id number] Procedure:                Upper GI endoscopy Indications:              Dysphagia Medicines:                Monitored Anesthesia Care Procedure:                Pre-Anesthesia Assessment:                           - Prior to the procedure, a History and Physical                            was performed, and patient medications and                            allergies were reviewed. The patient's tolerance of                            previous anesthesia was also reviewed. The risks                            and benefits of the procedure and the sedation                            options and risks were discussed with the patient.                            All questions were answered, and informed consent                            was obtained. Prior Anticoagulants: The patient has                            taken no previous anticoagulant or antiplatelet                            agents. ASA Grade Assessment: II - A patient with                            mild systemic disease. After reviewing the risks                            and benefits, the patient was deemed in                            satisfactory condition to undergo the procedure.                           After obtaining informed consent, the endoscope was  passed under direct vision. Throughout the                            procedure, the patient's blood pressure, pulse, and                            oxygen saturations were monitored continuously. The                            Endoscope was introduced through the mouth, and                            advanced to the second part of duodenum. The upper                            GI endoscopy was accomplished without  difficulty.                            The patient tolerated the procedure well. Scope In: Scope Out: Findings:                 The esophagus was mildly tortuous but normal. The                            Z-line was well-defined at 32 cm. Examined by NBI.                            It was decided, however, to proceed with dilation                            of the entire esophagus. The scope was withdrawn.                            Dilation was performed with a Maloney dilator with                            mild resistance at 50 Fr. Biopsies were obtained                            from the proximal and distal esophagus with cold                            forceps to r/o eosinophilic esophagitis.                           A 2 cm transient hiatal hernia was present.                           Localized minimal inflammation characterized by                            erythema was found in the gastric antrum. Biopsies  were taken with a cold forceps for histology.                           The examined duodenum was normal. Complications:            No immediate complications. Estimated Blood Loss:     Estimated blood loss: none. Impression:               -Mild presbyesophagus s/p dilatation.                           -2 cm hiatal hernia.                           -Mild gastritis. Recommendation:           - Patient has a contact number available for                            emergencies. The signs and symptoms of potential                            delayed complications were discussed with the                            patient. Return to normal activities tomorrow.                            Written discharge instructions were provided to the                            patient.                           - Post dilatation diet.                           - Continue present medications especially                            omeprazole 20 mg p.o. once a day.                            - Avoid nonsteroidals if possible.                           - Await pathology results.                           - Return to GI clinic PRN. Jackquline Denmark, MD 12/19/2019 11:29:29 AM This report has been signed electronically.

## 2019-12-19 NOTE — Progress Notes (Signed)
Called to room to assist during endoscopic procedure.  Patient ID and intended procedure confirmed with present staff. Received instructions for my participation in the procedure from the performing physician.  

## 2019-12-23 ENCOUNTER — Telehealth: Payer: Self-pay | Admitting: *Deleted

## 2019-12-23 NOTE — Telephone Encounter (Signed)
No answer for post procedure call back. Left message for patient to call with questions or concerns. 

## 2019-12-23 NOTE — Telephone Encounter (Signed)
Left message on f/u call 

## 2019-12-28 ENCOUNTER — Encounter: Payer: Self-pay | Admitting: Gastroenterology

## 2020-03-24 DIAGNOSIS — E785 Hyperlipidemia, unspecified: Secondary | ICD-10-CM | POA: Diagnosis not present

## 2020-03-24 DIAGNOSIS — E1169 Type 2 diabetes mellitus with other specified complication: Secondary | ICD-10-CM | POA: Diagnosis not present

## 2020-03-24 DIAGNOSIS — G629 Polyneuropathy, unspecified: Secondary | ICD-10-CM | POA: Diagnosis not present

## 2020-04-23 DIAGNOSIS — E785 Hyperlipidemia, unspecified: Secondary | ICD-10-CM | POA: Diagnosis not present

## 2020-04-23 DIAGNOSIS — D519 Vitamin B12 deficiency anemia, unspecified: Secondary | ICD-10-CM | POA: Diagnosis not present

## 2020-04-23 DIAGNOSIS — Z79899 Other long term (current) drug therapy: Secondary | ICD-10-CM | POA: Diagnosis not present

## 2020-04-23 DIAGNOSIS — D649 Anemia, unspecified: Secondary | ICD-10-CM | POA: Diagnosis not present

## 2020-04-23 DIAGNOSIS — E1169 Type 2 diabetes mellitus with other specified complication: Secondary | ICD-10-CM | POA: Diagnosis not present

## 2020-04-29 DIAGNOSIS — E538 Deficiency of other specified B group vitamins: Secondary | ICD-10-CM | POA: Diagnosis not present

## 2020-05-06 DIAGNOSIS — E538 Deficiency of other specified B group vitamins: Secondary | ICD-10-CM | POA: Diagnosis not present

## 2020-05-07 DIAGNOSIS — Z1211 Encounter for screening for malignant neoplasm of colon: Secondary | ICD-10-CM | POA: Diagnosis not present

## 2020-05-13 DIAGNOSIS — E538 Deficiency of other specified B group vitamins: Secondary | ICD-10-CM | POA: Diagnosis not present

## 2020-05-20 DIAGNOSIS — E538 Deficiency of other specified B group vitamins: Secondary | ICD-10-CM | POA: Diagnosis not present

## 2020-05-21 DIAGNOSIS — Z23 Encounter for immunization: Secondary | ICD-10-CM | POA: Diagnosis not present

## 2020-06-17 DIAGNOSIS — D519 Vitamin B12 deficiency anemia, unspecified: Secondary | ICD-10-CM | POA: Diagnosis not present

## 2020-06-22 DIAGNOSIS — H2513 Age-related nuclear cataract, bilateral: Secondary | ICD-10-CM | POA: Diagnosis not present

## 2020-06-22 DIAGNOSIS — H353131 Nonexudative age-related macular degeneration, bilateral, early dry stage: Secondary | ICD-10-CM | POA: Diagnosis not present

## 2020-06-26 DIAGNOSIS — Z01818 Encounter for other preprocedural examination: Secondary | ICD-10-CM | POA: Diagnosis not present

## 2020-06-26 DIAGNOSIS — H2512 Age-related nuclear cataract, left eye: Secondary | ICD-10-CM | POA: Diagnosis not present

## 2020-06-26 DIAGNOSIS — H2511 Age-related nuclear cataract, right eye: Secondary | ICD-10-CM | POA: Diagnosis not present

## 2020-06-26 DIAGNOSIS — E119 Type 2 diabetes mellitus without complications: Secondary | ICD-10-CM | POA: Diagnosis not present

## 2020-06-30 DIAGNOSIS — G629 Polyneuropathy, unspecified: Secondary | ICD-10-CM | POA: Diagnosis not present

## 2020-06-30 DIAGNOSIS — H259 Unspecified age-related cataract: Secondary | ICD-10-CM | POA: Diagnosis not present

## 2020-06-30 DIAGNOSIS — G4700 Insomnia, unspecified: Secondary | ICD-10-CM | POA: Diagnosis not present

## 2020-06-30 DIAGNOSIS — F17210 Nicotine dependence, cigarettes, uncomplicated: Secondary | ICD-10-CM | POA: Diagnosis not present

## 2020-06-30 DIAGNOSIS — H2511 Age-related nuclear cataract, right eye: Secondary | ICD-10-CM | POA: Diagnosis not present

## 2020-06-30 DIAGNOSIS — E1136 Type 2 diabetes mellitus with diabetic cataract: Secondary | ICD-10-CM | POA: Diagnosis not present

## 2020-06-30 DIAGNOSIS — H25811 Combined forms of age-related cataract, right eye: Secondary | ICD-10-CM | POA: Diagnosis not present

## 2020-06-30 DIAGNOSIS — K219 Gastro-esophageal reflux disease without esophagitis: Secondary | ICD-10-CM | POA: Diagnosis not present

## 2020-06-30 DIAGNOSIS — Z8669 Personal history of other diseases of the nervous system and sense organs: Secondary | ICD-10-CM | POA: Diagnosis not present

## 2020-06-30 DIAGNOSIS — E785 Hyperlipidemia, unspecified: Secondary | ICD-10-CM | POA: Diagnosis not present

## 2020-07-13 DIAGNOSIS — H2512 Age-related nuclear cataract, left eye: Secondary | ICD-10-CM | POA: Diagnosis not present

## 2020-08-02 IMAGING — DX DG CHEST 2V
2 series · 2 of 2 positions shown · non-contrast
Comparison: March 09, 2011

CLINICAL DATA: History of tobacco use.  Diabetes mellitus

EXAM:
CHEST - 2 VIEW

[dg chest 2 view (1 of 2)]
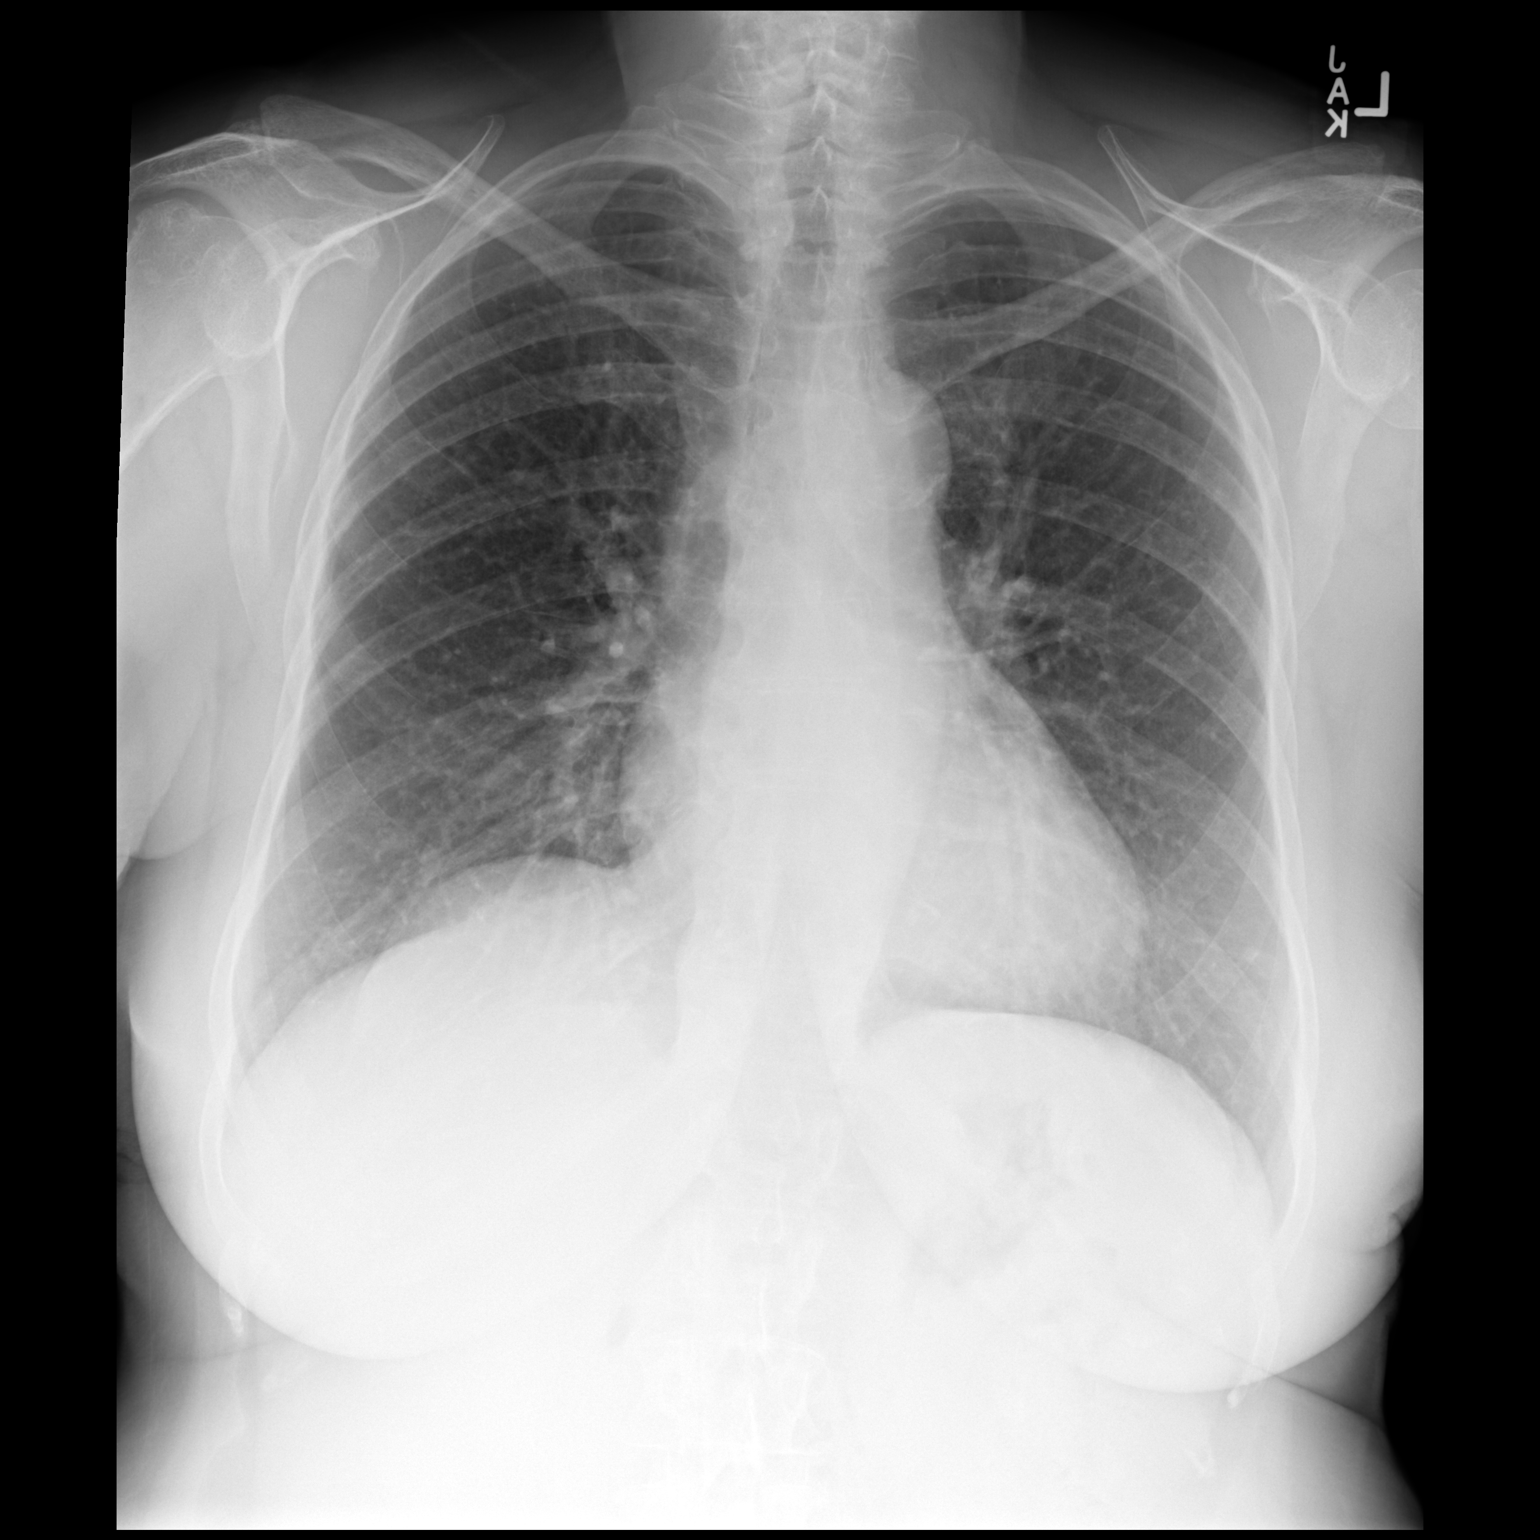

[dg chest 2 view (2 of 2)]
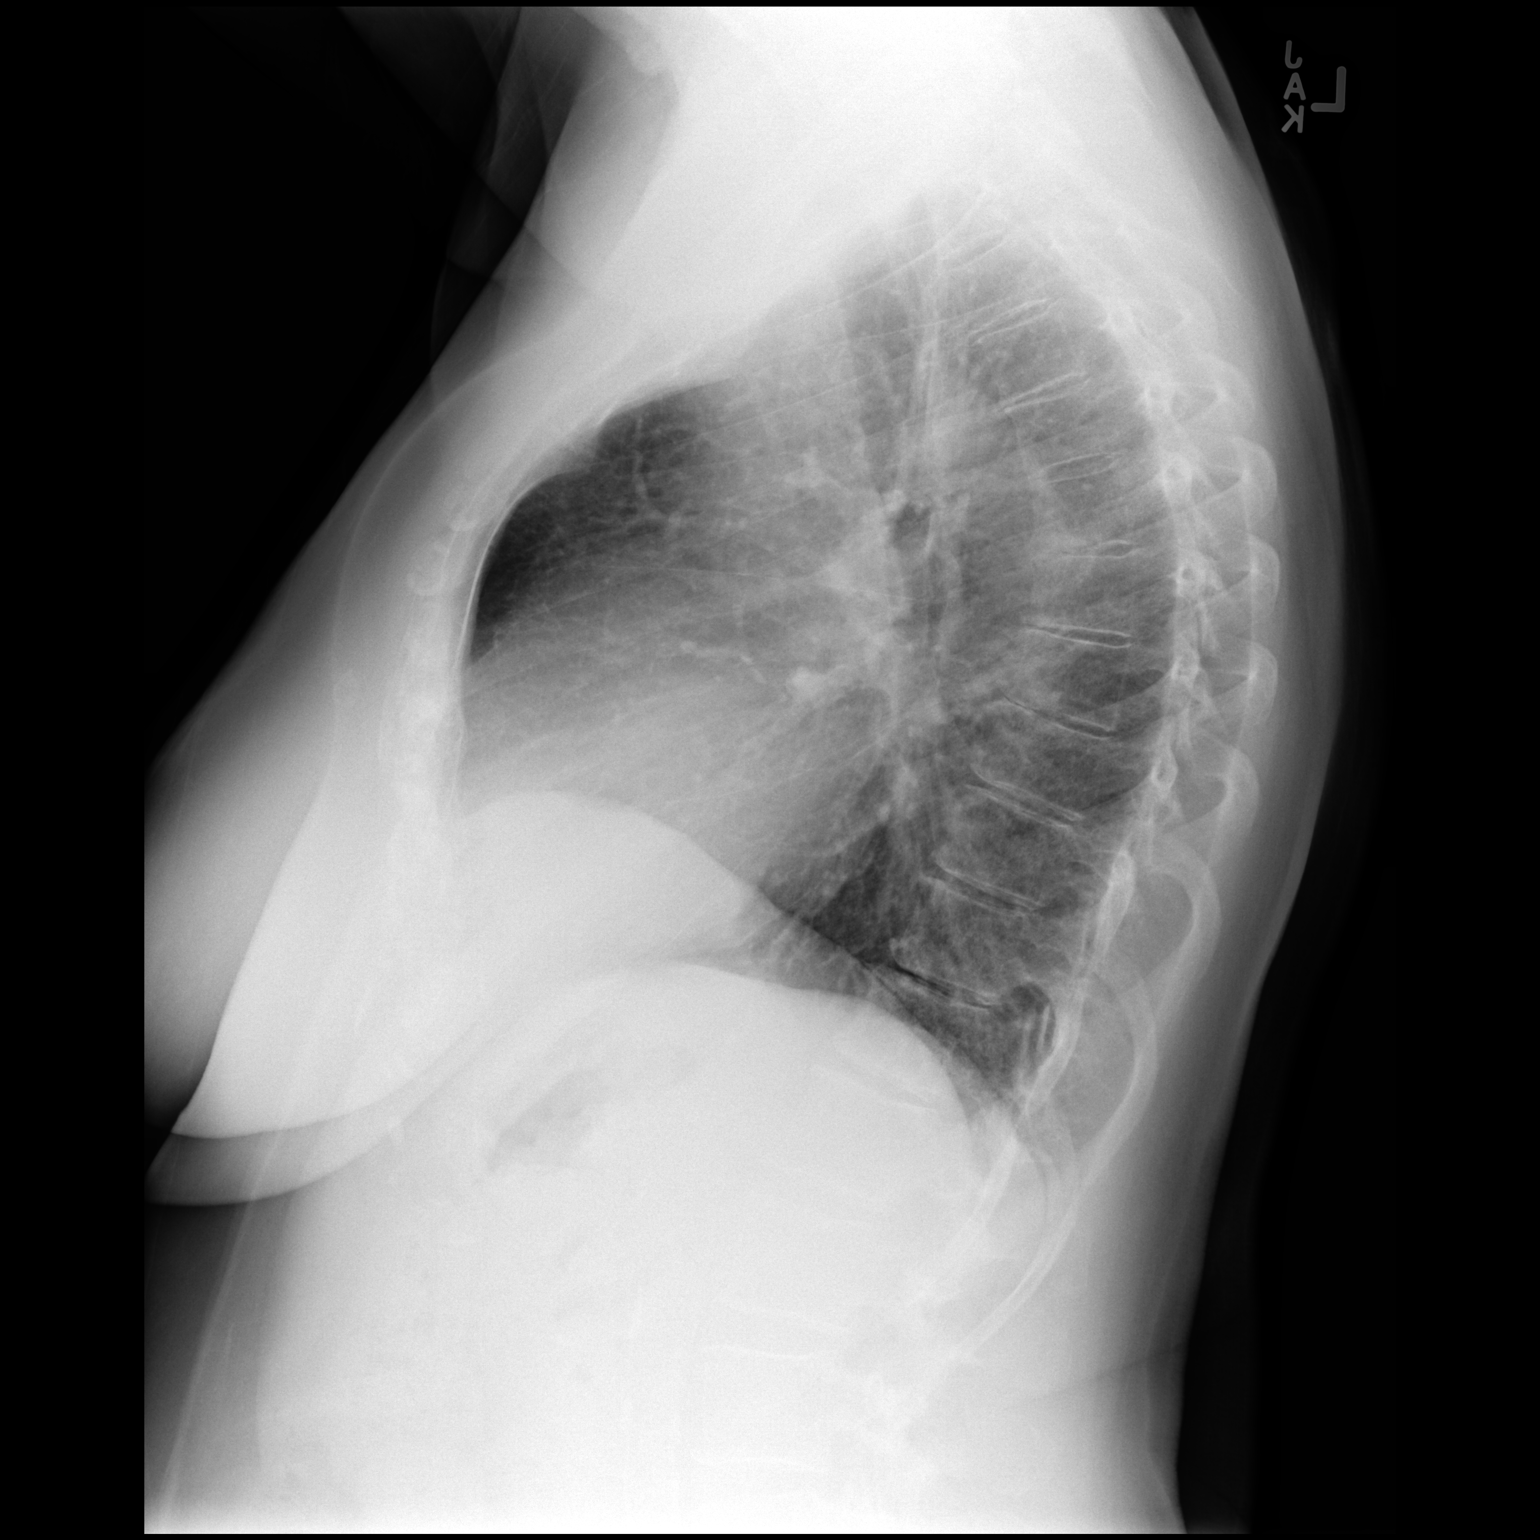

[2 of 2 positions shown; findings below may reference images not displayed]

FINDINGS: No edema or consolidation. Heart size and pulmonary vascularity are
normal. No adenopathy. There is aortic atherosclerosis. There is
mild degenerative change in the thoracic spine.
IMPRESSION: No edema or consolidation.  There is aortic atherosclerosis.

Aortic Atherosclerosis ([id number].B).

## 2020-09-21 DIAGNOSIS — G629 Polyneuropathy, unspecified: Secondary | ICD-10-CM | POA: Diagnosis not present

## 2020-09-21 DIAGNOSIS — E1169 Type 2 diabetes mellitus with other specified complication: Secondary | ICD-10-CM | POA: Diagnosis not present

## 2020-09-21 DIAGNOSIS — E785 Hyperlipidemia, unspecified: Secondary | ICD-10-CM | POA: Diagnosis not present

## 2020-09-23 DIAGNOSIS — D3131 Benign neoplasm of right choroid: Secondary | ICD-10-CM | POA: Diagnosis not present

## 2020-09-23 DIAGNOSIS — D3132 Benign neoplasm of left choroid: Secondary | ICD-10-CM | POA: Diagnosis not present

## 2020-09-23 DIAGNOSIS — H02834 Dermatochalasis of left upper eyelid: Secondary | ICD-10-CM | POA: Diagnosis not present

## 2020-09-23 DIAGNOSIS — H02831 Dermatochalasis of right upper eyelid: Secondary | ICD-10-CM | POA: Diagnosis not present

## 2020-10-07 DIAGNOSIS — Z20822 Contact with and (suspected) exposure to covid-19: Secondary | ICD-10-CM | POA: Diagnosis not present

## 2020-10-16 DIAGNOSIS — R059 Cough, unspecified: Secondary | ICD-10-CM | POA: Diagnosis not present

## 2020-10-22 DIAGNOSIS — E1169 Type 2 diabetes mellitus with other specified complication: Secondary | ICD-10-CM | POA: Diagnosis not present

## 2020-10-22 DIAGNOSIS — E785 Hyperlipidemia, unspecified: Secondary | ICD-10-CM | POA: Diagnosis not present

## 2020-10-22 DIAGNOSIS — G629 Polyneuropathy, unspecified: Secondary | ICD-10-CM | POA: Diagnosis not present

## 2020-10-27 DIAGNOSIS — E785 Hyperlipidemia, unspecified: Secondary | ICD-10-CM | POA: Diagnosis not present

## 2020-10-27 DIAGNOSIS — Z0000 Encounter for general adult medical examination without abnormal findings: Secondary | ICD-10-CM | POA: Diagnosis not present

## 2020-10-27 DIAGNOSIS — E1169 Type 2 diabetes mellitus with other specified complication: Secondary | ICD-10-CM | POA: Diagnosis not present

## 2020-10-27 DIAGNOSIS — Z6823 Body mass index (BMI) 23.0-23.9, adult: Secondary | ICD-10-CM | POA: Diagnosis not present

## 2020-10-27 DIAGNOSIS — R059 Cough, unspecified: Secondary | ICD-10-CM | POA: Diagnosis not present

## 2020-11-12 DIAGNOSIS — E785 Hyperlipidemia, unspecified: Secondary | ICD-10-CM | POA: Diagnosis not present

## 2020-11-12 DIAGNOSIS — G629 Polyneuropathy, unspecified: Secondary | ICD-10-CM | POA: Diagnosis not present

## 2020-11-13 DIAGNOSIS — D649 Anemia, unspecified: Secondary | ICD-10-CM | POA: Diagnosis not present

## 2020-11-22 DIAGNOSIS — G629 Polyneuropathy, unspecified: Secondary | ICD-10-CM | POA: Diagnosis not present

## 2020-11-22 DIAGNOSIS — E785 Hyperlipidemia, unspecified: Secondary | ICD-10-CM | POA: Diagnosis not present

## 2020-12-10 DIAGNOSIS — S91319A Laceration without foreign body, unspecified foot, initial encounter: Secondary | ICD-10-CM | POA: Diagnosis not present

## 2020-12-10 DIAGNOSIS — Z23 Encounter for immunization: Secondary | ICD-10-CM | POA: Diagnosis not present

## 2020-12-23 DIAGNOSIS — G629 Polyneuropathy, unspecified: Secondary | ICD-10-CM | POA: Diagnosis not present

## 2020-12-23 DIAGNOSIS — E785 Hyperlipidemia, unspecified: Secondary | ICD-10-CM | POA: Diagnosis not present

## 2021-01-18 DIAGNOSIS — Z6823 Body mass index (BMI) 23.0-23.9, adult: Secondary | ICD-10-CM | POA: Diagnosis not present

## 2021-01-18 DIAGNOSIS — L259 Unspecified contact dermatitis, unspecified cause: Secondary | ICD-10-CM | POA: Diagnosis not present

## 2021-02-02 DIAGNOSIS — Z01818 Encounter for other preprocedural examination: Secondary | ICD-10-CM | POA: Diagnosis not present

## 2021-02-02 DIAGNOSIS — H02831 Dermatochalasis of right upper eyelid: Secondary | ICD-10-CM | POA: Diagnosis not present

## 2021-02-02 DIAGNOSIS — H02132 Senile ectropion of right lower eyelid: Secondary | ICD-10-CM | POA: Diagnosis not present

## 2021-02-02 DIAGNOSIS — H02834 Dermatochalasis of left upper eyelid: Secondary | ICD-10-CM | POA: Diagnosis not present

## 2021-02-02 DIAGNOSIS — H02821 Cysts of right upper eyelid: Secondary | ICD-10-CM | POA: Diagnosis not present

## 2021-02-02 DIAGNOSIS — H02135 Senile ectropion of left lower eyelid: Secondary | ICD-10-CM | POA: Diagnosis not present

## 2021-02-16 DIAGNOSIS — E559 Vitamin D deficiency, unspecified: Secondary | ICD-10-CM | POA: Diagnosis not present

## 2021-02-16 DIAGNOSIS — Z6823 Body mass index (BMI) 23.0-23.9, adult: Secondary | ICD-10-CM | POA: Diagnosis not present

## 2021-02-16 DIAGNOSIS — Z79899 Other long term (current) drug therapy: Secondary | ICD-10-CM | POA: Diagnosis not present

## 2021-02-16 DIAGNOSIS — G4762 Sleep related leg cramps: Secondary | ICD-10-CM | POA: Diagnosis not present

## 2021-02-22 DIAGNOSIS — E785 Hyperlipidemia, unspecified: Secondary | ICD-10-CM | POA: Diagnosis not present

## 2021-02-22 DIAGNOSIS — I10 Essential (primary) hypertension: Secondary | ICD-10-CM | POA: Diagnosis not present

## 2021-03-10 DIAGNOSIS — H02135 Senile ectropion of left lower eyelid: Secondary | ICD-10-CM | POA: Diagnosis not present

## 2021-03-10 DIAGNOSIS — H02132 Senile ectropion of right lower eyelid: Secondary | ICD-10-CM | POA: Diagnosis not present

## 2021-03-10 DIAGNOSIS — H02831 Dermatochalasis of right upper eyelid: Secondary | ICD-10-CM | POA: Diagnosis not present

## 2021-03-10 DIAGNOSIS — H02834 Dermatochalasis of left upper eyelid: Secondary | ICD-10-CM | POA: Diagnosis not present

## 2021-05-23 DIAGNOSIS — R42 Dizziness and giddiness: Secondary | ICD-10-CM | POA: Diagnosis not present

## 2021-05-23 DIAGNOSIS — Z79899 Other long term (current) drug therapy: Secondary | ICD-10-CM | POA: Diagnosis not present

## 2021-05-23 DIAGNOSIS — I700 Atherosclerosis of aorta: Secondary | ICD-10-CM | POA: Diagnosis not present

## 2021-05-25 DIAGNOSIS — I10 Essential (primary) hypertension: Secondary | ICD-10-CM | POA: Diagnosis not present

## 2021-05-25 DIAGNOSIS — E782 Mixed hyperlipidemia: Secondary | ICD-10-CM | POA: Diagnosis not present

## 2021-05-26 ENCOUNTER — Telehealth: Payer: Self-pay | Admitting: Gastroenterology

## 2021-05-26 NOTE — Telephone Encounter (Signed)
Pt stated that her Dr. Gabriel Carina had called her and told her that she had Hepatitis C and for her to reach out to Dr. Lyndel Safe office in regard to this. Pt was scheduled for an office visit with Dr. Lyndel Safe on 06/09/2021 at 9:30 AM. Pt made aware: Address Provided to [facility]: Pt was given Fax # [phone] to [facility] so that she could request her labs from her Dr. Gabriel Carina to be faxed to Dr. Lyndel Safe office. Pt verbalized understanding with all questions answered.

## 2021-05-26 NOTE — Telephone Encounter (Signed)
Patient called and stated that she had labs done about a year ago and something was up with her levels. Patient stated that Dr. Danella Deis office was going to call and see what was up but then had patient to call. Seeking advice, please advise.

## 2021-06-09 ENCOUNTER — Ambulatory Visit: Payer: Medicare Other | Admitting: Gastroenterology

## 2021-06-22 DIAGNOSIS — E782 Mixed hyperlipidemia: Secondary | ICD-10-CM | POA: Diagnosis not present

## 2021-06-22 DIAGNOSIS — I10 Essential (primary) hypertension: Secondary | ICD-10-CM | POA: Diagnosis not present

## 2021-09-03 DIAGNOSIS — M5440 Lumbago with sciatica, unspecified side: Secondary | ICD-10-CM | POA: Diagnosis not present

## 2021-09-06 DIAGNOSIS — E1169 Type 2 diabetes mellitus with other specified complication: Secondary | ICD-10-CM | POA: Diagnosis not present

## 2021-09-06 DIAGNOSIS — M8580 Other specified disorders of bone density and structure, unspecified site: Secondary | ICD-10-CM | POA: Diagnosis not present

## 2021-09-06 DIAGNOSIS — R059 Cough, unspecified: Secondary | ICD-10-CM | POA: Diagnosis not present

## 2021-09-06 DIAGNOSIS — Z780 Asymptomatic menopausal state: Secondary | ICD-10-CM | POA: Diagnosis not present

## 2021-09-06 DIAGNOSIS — Z0000 Encounter for general adult medical examination without abnormal findings: Secondary | ICD-10-CM | POA: Diagnosis not present

## 2021-09-06 DIAGNOSIS — E785 Hyperlipidemia, unspecified: Secondary | ICD-10-CM | POA: Diagnosis not present

## 2021-09-14 DIAGNOSIS — Z6824 Body mass index (BMI) 24.0-24.9, adult: Secondary | ICD-10-CM | POA: Diagnosis not present

## 2021-09-14 DIAGNOSIS — B029 Zoster without complications: Secondary | ICD-10-CM | POA: Diagnosis not present

## 2021-09-22 DIAGNOSIS — I10 Essential (primary) hypertension: Secondary | ICD-10-CM | POA: Diagnosis not present

## 2021-09-22 DIAGNOSIS — E785 Hyperlipidemia, unspecified: Secondary | ICD-10-CM | POA: Diagnosis not present

## 2021-09-27 DIAGNOSIS — E559 Vitamin D deficiency, unspecified: Secondary | ICD-10-CM | POA: Diagnosis not present

## 2022-01-20 DIAGNOSIS — M79672 Pain in left foot: Secondary | ICD-10-CM | POA: Diagnosis not present

## 2022-01-20 DIAGNOSIS — Z6824 Body mass index (BMI) 24.0-24.9, adult: Secondary | ICD-10-CM | POA: Diagnosis not present

## 2022-01-20 DIAGNOSIS — M2540 Effusion, unspecified joint: Secondary | ICD-10-CM | POA: Diagnosis not present

## 2022-02-22 DIAGNOSIS — E1169 Type 2 diabetes mellitus with other specified complication: Secondary | ICD-10-CM | POA: Diagnosis not present

## 2022-03-02 DIAGNOSIS — Z1211 Encounter for screening for malignant neoplasm of colon: Secondary | ICD-10-CM | POA: Diagnosis not present

## 2022-03-13 LAB — COLOGUARD: COLOGUARD: NEGATIVE

## 2022-03-23 DIAGNOSIS — E119 Type 2 diabetes mellitus without complications: Secondary | ICD-10-CM | POA: Diagnosis not present

## 2022-03-23 DIAGNOSIS — D3132 Benign neoplasm of left choroid: Secondary | ICD-10-CM | POA: Diagnosis not present

## 2022-03-23 DIAGNOSIS — D3131 Benign neoplasm of right choroid: Secondary | ICD-10-CM | POA: Diagnosis not present

## 2022-03-23 DIAGNOSIS — H543 Unqualified visual loss, both eyes: Secondary | ICD-10-CM | POA: Diagnosis not present

## 2022-04-11 DIAGNOSIS — H04123 Dry eye syndrome of bilateral lacrimal glands: Secondary | ICD-10-CM | POA: Diagnosis not present

## 2022-04-11 DIAGNOSIS — H52223 Regular astigmatism, bilateral: Secondary | ICD-10-CM | POA: Diagnosis not present

## 2022-05-02 DIAGNOSIS — H524 Presbyopia: Secondary | ICD-10-CM | POA: Diagnosis not present

## 2022-05-03 DIAGNOSIS — Z0100 Encounter for examination of eyes and vision without abnormal findings: Secondary | ICD-10-CM | POA: Diagnosis not present

## 2022-06-24 DIAGNOSIS — J40 Bronchitis, not specified as acute or chronic: Secondary | ICD-10-CM | POA: Diagnosis not present

## 2022-06-24 DIAGNOSIS — J329 Chronic sinusitis, unspecified: Secondary | ICD-10-CM | POA: Diagnosis not present

## 2022-07-19 DIAGNOSIS — G40A09 Absence epileptic syndrome, not intractable, without status epilepticus: Secondary | ICD-10-CM | POA: Diagnosis not present

## 2022-07-19 DIAGNOSIS — R059 Cough, unspecified: Secondary | ICD-10-CM | POA: Diagnosis not present

## 2022-07-28 DIAGNOSIS — D3132 Benign neoplasm of left choroid: Secondary | ICD-10-CM | POA: Diagnosis not present

## 2022-07-28 DIAGNOSIS — D3131 Benign neoplasm of right choroid: Secondary | ICD-10-CM | POA: Diagnosis not present

## 2022-07-28 DIAGNOSIS — E119 Type 2 diabetes mellitus without complications: Secondary | ICD-10-CM | POA: Diagnosis not present

## 2022-09-22 DIAGNOSIS — Z6826 Body mass index (BMI) 26.0-26.9, adult: Secondary | ICD-10-CM | POA: Diagnosis not present

## 2022-09-22 DIAGNOSIS — Z0000 Encounter for general adult medical examination without abnormal findings: Secondary | ICD-10-CM | POA: Diagnosis not present

## 2022-09-22 DIAGNOSIS — E1169 Type 2 diabetes mellitus with other specified complication: Secondary | ICD-10-CM | POA: Diagnosis not present

## 2022-09-22 DIAGNOSIS — Z1331 Encounter for screening for depression: Secondary | ICD-10-CM | POA: Diagnosis not present

## 2022-09-22 DIAGNOSIS — E559 Vitamin D deficiency, unspecified: Secondary | ICD-10-CM | POA: Diagnosis not present

## 2022-09-22 DIAGNOSIS — Z1339 Encounter for screening examination for other mental health and behavioral disorders: Secondary | ICD-10-CM | POA: Diagnosis not present

## 2022-09-22 DIAGNOSIS — Z79899 Other long term (current) drug therapy: Secondary | ICD-10-CM | POA: Diagnosis not present

## 2022-09-22 DIAGNOSIS — E785 Hyperlipidemia, unspecified: Secondary | ICD-10-CM | POA: Diagnosis not present

## 2022-09-23 DIAGNOSIS — Z0000 Encounter for general adult medical examination without abnormal findings: Secondary | ICD-10-CM | POA: Diagnosis not present

## 2022-10-10 DIAGNOSIS — Z1231 Encounter for screening mammogram for malignant neoplasm of breast: Secondary | ICD-10-CM | POA: Diagnosis not present

## 2022-11-29 DIAGNOSIS — J329 Chronic sinusitis, unspecified: Secondary | ICD-10-CM | POA: Diagnosis not present

## 2022-11-29 DIAGNOSIS — J40 Bronchitis, not specified as acute or chronic: Secondary | ICD-10-CM | POA: Diagnosis not present

## 2022-11-29 DIAGNOSIS — Z6826 Body mass index (BMI) 26.0-26.9, adult: Secondary | ICD-10-CM | POA: Diagnosis not present

## 2023-01-10 DIAGNOSIS — J40 Bronchitis, not specified as acute or chronic: Secondary | ICD-10-CM | POA: Diagnosis not present

## 2023-01-10 DIAGNOSIS — J329 Chronic sinusitis, unspecified: Secondary | ICD-10-CM | POA: Diagnosis not present

## 2023-01-10 DIAGNOSIS — Z6826 Body mass index (BMI) 26.0-26.9, adult: Secondary | ICD-10-CM | POA: Diagnosis not present

## 2023-01-30 DIAGNOSIS — J189 Pneumonia, unspecified organism: Secondary | ICD-10-CM | POA: Diagnosis not present

## 2023-01-30 DIAGNOSIS — Z6826 Body mass index (BMI) 26.0-26.9, adult: Secondary | ICD-10-CM | POA: Diagnosis not present

## 2023-03-02 DIAGNOSIS — F4321 Adjustment disorder with depressed mood: Secondary | ICD-10-CM | POA: Diagnosis not present

## 2023-03-02 DIAGNOSIS — G4700 Insomnia, unspecified: Secondary | ICD-10-CM | POA: Diagnosis not present

## 2023-03-02 DIAGNOSIS — Z6825 Body mass index (BMI) 25.0-25.9, adult: Secondary | ICD-10-CM | POA: Diagnosis not present

## 2023-03-02 DIAGNOSIS — Z634 Disappearance and death of family member: Secondary | ICD-10-CM | POA: Diagnosis not present

## 2023-03-05 DIAGNOSIS — M79671 Pain in right foot: Secondary | ICD-10-CM | POA: Diagnosis not present

## 2023-03-05 DIAGNOSIS — M79672 Pain in left foot: Secondary | ICD-10-CM | POA: Diagnosis not present

## 2023-04-05 DIAGNOSIS — Z411 Encounter for cosmetic surgery: Secondary | ICD-10-CM | POA: Diagnosis not present

## 2023-04-05 DIAGNOSIS — D224 Melanocytic nevi of scalp and neck: Secondary | ICD-10-CM | POA: Diagnosis not present

## 2023-06-08 DIAGNOSIS — Z6825 Body mass index (BMI) 25.0-25.9, adult: Secondary | ICD-10-CM | POA: Diagnosis not present

## 2023-06-08 DIAGNOSIS — J209 Acute bronchitis, unspecified: Secondary | ICD-10-CM | POA: Diagnosis not present

## 2023-07-12 DIAGNOSIS — Z6830 Body mass index (BMI) 30.0-30.9, adult: Secondary | ICD-10-CM | POA: Diagnosis not present

## 2023-07-12 DIAGNOSIS — J40 Bronchitis, not specified as acute or chronic: Secondary | ICD-10-CM | POA: Diagnosis not present

## 2023-08-22 DIAGNOSIS — R053 Chronic cough: Secondary | ICD-10-CM | POA: Diagnosis not present

## 2023-08-22 DIAGNOSIS — Z6825 Body mass index (BMI) 25.0-25.9, adult: Secondary | ICD-10-CM | POA: Diagnosis not present

## 2023-09-06 DIAGNOSIS — R053 Chronic cough: Secondary | ICD-10-CM | POA: Diagnosis not present

## 2023-09-06 DIAGNOSIS — Z6825 Body mass index (BMI) 25.0-25.9, adult: Secondary | ICD-10-CM | POA: Diagnosis not present

## 2023-10-17 DIAGNOSIS — Z0000 Encounter for general adult medical examination without abnormal findings: Secondary | ICD-10-CM | POA: Diagnosis not present

## 2023-10-17 DIAGNOSIS — E1169 Type 2 diabetes mellitus with other specified complication: Secondary | ICD-10-CM | POA: Diagnosis not present

## 2023-10-17 DIAGNOSIS — M8580 Other specified disorders of bone density and structure, unspecified site: Secondary | ICD-10-CM | POA: Diagnosis not present

## 2023-10-17 DIAGNOSIS — R252 Cramp and spasm: Secondary | ICD-10-CM | POA: Diagnosis not present

## 2023-10-17 DIAGNOSIS — D519 Vitamin B12 deficiency anemia, unspecified: Secondary | ICD-10-CM | POA: Diagnosis not present

## 2023-10-17 DIAGNOSIS — Z79899 Other long term (current) drug therapy: Secondary | ICD-10-CM | POA: Diagnosis not present

## 2023-10-17 DIAGNOSIS — Z1339 Encounter for screening examination for other mental health and behavioral disorders: Secondary | ICD-10-CM | POA: Diagnosis not present

## 2023-10-17 DIAGNOSIS — E559 Vitamin D deficiency, unspecified: Secondary | ICD-10-CM | POA: Diagnosis not present

## 2023-10-17 DIAGNOSIS — Z1331 Encounter for screening for depression: Secondary | ICD-10-CM | POA: Diagnosis not present

## 2023-10-17 DIAGNOSIS — E785 Hyperlipidemia, unspecified: Secondary | ICD-10-CM | POA: Diagnosis not present

## 2023-10-31 DIAGNOSIS — Z1231 Encounter for screening mammogram for malignant neoplasm of breast: Secondary | ICD-10-CM | POA: Diagnosis not present

## 2023-12-19 DIAGNOSIS — R053 Chronic cough: Secondary | ICD-10-CM | POA: Diagnosis not present

## 2023-12-19 DIAGNOSIS — F17210 Nicotine dependence, cigarettes, uncomplicated: Secondary | ICD-10-CM | POA: Diagnosis not present

## 2023-12-19 DIAGNOSIS — J449 Chronic obstructive pulmonary disease, unspecified: Secondary | ICD-10-CM | POA: Diagnosis not present

## 2024-02-15 DIAGNOSIS — Z6824 Body mass index (BMI) 24.0-24.9, adult: Secondary | ICD-10-CM | POA: Diagnosis not present

## 2024-02-15 DIAGNOSIS — H6092 Unspecified otitis externa, left ear: Secondary | ICD-10-CM | POA: Diagnosis not present

## 2024-02-28 DIAGNOSIS — F17210 Nicotine dependence, cigarettes, uncomplicated: Secondary | ICD-10-CM | POA: Diagnosis not present

## 2024-02-28 DIAGNOSIS — R053 Chronic cough: Secondary | ICD-10-CM | POA: Diagnosis not present

## 2024-02-28 DIAGNOSIS — J449 Chronic obstructive pulmonary disease, unspecified: Secondary | ICD-10-CM | POA: Diagnosis not present

## 2024-03-06 DIAGNOSIS — Z122 Encounter for screening for malignant neoplasm of respiratory organs: Secondary | ICD-10-CM | POA: Diagnosis not present

## 2024-03-06 DIAGNOSIS — I2510 Atherosclerotic heart disease of native coronary artery without angina pectoris: Secondary | ICD-10-CM | POA: Diagnosis not present

## 2024-03-06 DIAGNOSIS — F17210 Nicotine dependence, cigarettes, uncomplicated: Secondary | ICD-10-CM | POA: Diagnosis not present

## 2024-03-07 DIAGNOSIS — Z87891 Personal history of nicotine dependence: Secondary | ICD-10-CM | POA: Diagnosis not present

## 2024-03-26 NOTE — Progress Notes (Signed)
 Yolanda Woods                                          MRN: [id number]   03/26/2024   The [facility] Quality Team Specialist reviewed this patient medical record for the purposes of chart review for care gap closure. The following were reviewed: chart review for care gap closure-kidney health evaluation for diabetes:eGFR  and uACR.    [facility]BCI Quality Team
